# Patient Record
Sex: Male | Born: 2000 | Race: Black or African American | Hispanic: No | Marital: Single | Smoking: Current every day smoker
Health system: Southern US, Community
[De-identification: ages and names within clinical notes are randomized; demographics above are authoritative.]

## PROBLEM LIST (undated history)

## (undated) DIAGNOSIS — K219 Gastro-esophageal reflux disease without esophagitis: Secondary | ICD-10-CM

## (undated) DIAGNOSIS — J309 Allergic rhinitis, unspecified: Secondary | ICD-10-CM

## (undated) DIAGNOSIS — J45909 Unspecified asthma, uncomplicated: Secondary | ICD-10-CM

## (undated) HISTORY — DX: Allergic rhinitis, unspecified: J30.9

## (undated) HISTORY — DX: Gastro-esophageal reflux disease without esophagitis: K21.9

## (undated) HISTORY — DX: Unspecified asthma, uncomplicated: J45.909

## (undated) HISTORY — PX: EYE SURGERY: SHX253

---
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
## 2019-08-17 ENCOUNTER — Telehealth: Payer: Self-pay | Admitting: Allergy & Immunology

## 2019-08-17 MED ORDER — ALBUTEROL SULFATE HFA 108 (90 BASE) MCG/ACT IN AERS: 2.0000 | INHALATION_SPRAY | RESPIRATORY_TRACT | 0 refills | Status: AC | PRN

## 2019-08-17 NOTE — Telephone Encounter (Signed)
Mom called requesting a refill of his albuterol due to a persistent cough. Refill sent in. Mom also requested an office visit for Monday. I placed him into a 10:45 slot with Gareth Morgan.   Salvatore Marvel, MD [facility]

## 2019-08-19 ENCOUNTER — Encounter: Payer: Self-pay | Admitting: Family Medicine

## 2019-08-19 ENCOUNTER — Ambulatory Visit (INDEPENDENT_AMBULATORY_CARE_PROVIDER_SITE_OTHER): Payer: Medicaid Other | Admitting: Family Medicine

## 2019-08-19 ENCOUNTER — Other Ambulatory Visit: Payer: Self-pay

## 2019-08-19 VITALS — BP 118/72 | HR 72 | Temp 98.3°F | Resp 20 | Ht 67.95 in | Wt 126.0 lb

## 2019-08-19 DIAGNOSIS — T7800XD Anaphylactic reaction due to unspecified food, subsequent encounter: Secondary | ICD-10-CM | POA: Diagnosis not present

## 2019-08-19 DIAGNOSIS — J4531 Mild persistent asthma with (acute) exacerbation: Secondary | ICD-10-CM | POA: Diagnosis not present

## 2019-08-19 DIAGNOSIS — T7800XA Anaphylactic reaction due to unspecified food, initial encounter: Secondary | ICD-10-CM | POA: Insufficient documentation

## 2019-08-19 DIAGNOSIS — J301 Allergic rhinitis due to pollen: Secondary | ICD-10-CM | POA: Diagnosis not present

## 2019-08-19 MED ORDER — FLUTICASONE PROPIONATE 50 MCG/ACT NA SUSP: NASAL | 5 refills | Status: AC

## 2019-08-19 MED ORDER — FLOVENT HFA 110 MCG/ACT IN AERO: 2.0000 | INHALATION_SPRAY | Freq: Two times a day (BID) | RESPIRATORY_TRACT | 5 refills | Status: AC

## 2019-08-19 MED ORDER — CETIRIZINE HCL 10 MG PO TABS: ORAL_TABLET | ORAL | 5 refills | Status: AC

## 2019-08-19 MED ORDER — FLOVENT HFA 44 MCG/ACT IN AERO: 2.0000 | INHALATION_SPRAY | Freq: Two times a day (BID) | RESPIRATORY_TRACT | 5 refills | Status: DC

## 2019-08-19 MED ORDER — EPINEPHRINE 0.3 MG/0.3ML IJ SOAJ: INTRAMUSCULAR | 3 refills | Status: AC

## 2019-08-19 MED ORDER — ALBUTEROL SULFATE HFA 108 (90 BASE) MCG/ACT IN AERS: INHALATION_SPRAY | RESPIRATORY_TRACT | 1 refills | Status: AC

## 2019-08-19 MED ORDER — MONTELUKAST SODIUM 10 MG PO TABS: 10.0000 mg | ORAL_TABLET | Freq: Every day | ORAL | 5 refills | Status: AC

## 2019-08-19 NOTE — Progress Notes (Signed)
[address] [place] [id number] Dept: [phone]  FOLLOW UP NOTE  Patient ID: Benjamin Poole, male    DOB: 03/02/2001  Age: 18 y.o. MRN: [id number] Date of Office Visit: 08/19/2019  Assessment  Chief Complaint: Asthma and Allergic Rhinitis   HPI Benjamin Poole is a 18 year old male who presents to the clinic for an evaluation of an asthma flare. He was last seen in this clinic on 11/25/2016 by Dr. Dellis AnesGallagher for evaluation of asthma and allergic rhinitis, food allergy, and acute sinusitis. He reports his asthma has not been well controlled over the last 3 days with shortness of breath, dry cough, and wheeze which is worse at night. He is currently using ProAir about 2 days a week. He is out of montelukast and Qvar at this time and last used these about 1 year ago. Allergic rhinitis is reported as moderately well controlled with runny nose for which he is using cetirizine and Flonase as needed, which is infrequently. He continues to avoid eggs in lesser cooked forma and is eating products with baked egg with no adverse effects. He does not have an epinephrine device at this time. His current medications are listed in the chart.    Drug Allergies:  Allergies  Allergen Reactions  . Eggs Or Egg-Derived Products Hives    Physical Exam: BP 118/72   Pulse 72   Temp 98.3 F (36.8 C) (Oral)   Resp 20   Ht 5' 7.95" (1.726 m)   Wt 126 lb (57.2 kg)   SpO2 99%   BMI 19.18 kg/m    Physical Exam Vitals signs reviewed.  Constitutional:      Appearance: Normal appearance.  HENT:     Head: Normocephalic and atraumatic.     Right Ear: Tympanic membrane normal.     Left Ear: Tympanic membrane normal.     Nose:     Comments: Bilateral nares slightly erythematous with clear nasal drainage noted. pharynx normal. Ears normal. Eyes normal.    Mouth/Throat:     Pharynx: Oropharynx is clear.  Eyes:     Conjunctiva/sclera: Conjunctivae normal.  Neck:     Musculoskeletal: Normal range of  motion and neck supple.  Cardiovascular:     Rate and Rhythm: Normal rate and regular rhythm.     Heart sounds: Normal heart sounds. No murmur.  Pulmonary:     Effort: Pulmonary effort is normal.     Breath sounds: Normal breath sounds.     Comments: Lungs clear to auscultation Musculoskeletal: Normal range of motion.  Skin:    General: Skin is warm and dry.  Neurological:     Mental Status: He is alert and oriented to person, place, and time.  Psychiatric:        Mood and Affect: Mood normal.        Behavior: Behavior normal.        Thought Content: Thought content normal.        Judgment: Judgment normal.     Diagnostics: FVC 4.66, FEV1 3.97. Predicted FVC 4.32, predicted FEV1 3.72. Spirometry indicates normal ventilatory function.  Assessment and Plan: 1. Mild persistent asthma with acute exacerbation   2. Allergic rhinitis due to pollen, unspecified seasonality   3. Anaphylactic shock due to food, subsequent encounter     Meds ordered this encounter  Medications  . EPINEPHrine (EPIPEN 2-PAK) 0.3 mg/0.3 mL IJ SOAJ injection    Sig: Use as directed for severe allergic reactions    Dispense:  4 each    Refill:  3    Put on hold, will call when needed  . cetirizine (ZYRTEC) 10 MG tablet    Sig: One tablet daily as needed for runny nose    Dispense:  30 tablet    Refill:  5  . fluticasone (FLONASE) 50 MCG/ACT nasal spray    Sig: 1-2 sprays in each nostril once a day as needed for stuffy nose    Dispense:  16 g    Refill:  5  . albuterol (PROAIR HFA) 108 (90 Base) MCG/ACT inhaler    Sig: INHALE 2 PUFFS INTO THE LUNGS EVERY 4 (FOUR) HOURS AS NEEDED FOR COUGHING, WHEEZING OR SHORTNESS OF BREATH. Use with spacer    Dispense:  18 g    Refill:  1  . DISCONTD: fluticasone (FLOVENT HFA) 44 MCG/ACT inhaler    Sig: Inhale 2 puffs into the lungs 2 (two) times daily. Use with spacer. Rinse, gargle and spit out after use.    Dispense:  1 Inhaler    Refill:  5  . montelukast  (SINGULAIR) 10 MG tablet    Sig: Take 1 tablet (10 mg total) by mouth at bedtime.    Dispense:  34 tablet    Refill:  5  . fluticasone (FLOVENT HFA) 110 MCG/ACT inhaler    Sig: Inhale 2 puffs into the lungs 2 (two) times daily. Use with spacer. Rinse, gargle and spit out after use    Dispense:  1 Inhaler    Refill:  5    Patient Instructions  Asthma  Begin montelukast 10 mg once a day to prevent cough or wheeze For now and for asthma flares, begin Flovent 110-2 puffs twice a day with a spacer for 2 weeks or until cough and wheeze free Continue ProAir 2 puffs every 4 hours as needed for cough or wheeze  Allergic rhinitis Continue cetrizine 10 mg once a day as needed for a runny nose Begin Flonase 1-2 sprays in each nostril once a day as needed for a stuffy nose Consider nasal saline rinses as needed for nasal symptoms  Food allergy Continue to avoid egg. Continue to eat products with cooked egg as you are tolerating these with no symptoms (some examples are cake, pasta, and breads). In case of an allergic reaction, give Benadryl 50 mg every 4 hours, and if life-threatening symptoms occur, inject with EpiPen 0.3 mg.  Call the clinic if this treatment plan is not working well for you  Follow up in 2 months or sooner if needed  Thank you for the opportunity to care for this patient.  Please do not hesitate to contact me with questions.  Gareth Morgan, FNP [facility]  I have provided oversight concerning Gareth Morgan' evaluation and treatment of this patient's health issues addressed during today's encounter. I agree with the assessment and therapeutic plan as outlined in the note.   Return in about 2 months (around 10/19/2019), or if symptoms worsen or fail to improve.    Thank you for the opportunity to care for this patient.  Please do not hesitate to contact me with questions.  Penne Lash, M.D.  [facility] [address] [place], [place] [address] [phone]

## 2019-08-19 NOTE — Patient Instructions (Addendum)
Asthma  Begin montelukast 10 mg once a day to prevent cough or wheeze For now and for asthma flares, begin Flovent 110-2 puffs twice a day with a spacer for 2 weeks or until cough and wheeze free Continue ProAir 2 puffs every 4 hours as needed for cough or wheeze  Allergic rhinitis Continue cetrizine 10 mg once a day as needed for a runny nose Begin Flonase 1-2 sprays in each nostril once a day as needed for a stuffy nose Consider nasal saline rinses as needed for nasal symptoms  Food allergy Continue to avoid egg. Continue to eat products with cooked egg as you are tolerating these with no symptoms (some examples are cake, pasta, and breads). In case of an allergic reaction, give Benadryl 50 mg every 4 hours, and if life-threatening symptoms occur, inject with EpiPen 0.3 mg.  Call the clinic if this treatment plan is not working well for you  Follow up in 2 months or sooner if needed

## 2019-09-18 ENCOUNTER — Telehealth: Payer: Self-pay | Admitting: Allergy & Immunology

## 2019-09-18 NOTE — Telephone Encounter (Signed)
Pt last seen 9/14. Please advise if we can schedule pt to come get flu vaccine with [place].

## 2019-09-18 NOTE — Telephone Encounter (Signed)
Can you please have him make an appointment for skin testing to egg. His egg and egg components were negative. We have never skin tested him. Thank you.

## 2019-09-18 NOTE — Telephone Encounter (Signed)
Pt mom called to see if we had the flu shot without the egg. [phone].

## 2019-09-19 NOTE — Telephone Encounter (Signed)
Tried calling [phone] and line says unavailable.

## 2019-09-20 NOTE — Telephone Encounter (Signed)
Tried calling x 2- # unavailable.

## 2022-01-13 ENCOUNTER — Emergency Department (HOSPITAL_BASED_OUTPATIENT_CLINIC_OR_DEPARTMENT_OTHER)
Admission: EM | Admit: 2022-01-13 | Discharge: 2022-01-13 | Disposition: A | Discharge status: Home / Self Care | Payer: Worker's Compensation | Attending: Emergency Medicine | Admitting: Emergency Medicine

## 2022-01-13 ENCOUNTER — Emergency Department (HOSPITAL_BASED_OUTPATIENT_CLINIC_OR_DEPARTMENT_OTHER): Payer: Worker's Compensation

## 2022-01-13 ENCOUNTER — Other Ambulatory Visit: Payer: Self-pay

## 2022-01-13 ENCOUNTER — Other Ambulatory Visit (HOSPITAL_BASED_OUTPATIENT_CLINIC_OR_DEPARTMENT_OTHER): Payer: Self-pay

## 2022-01-13 ENCOUNTER — Encounter (HOSPITAL_BASED_OUTPATIENT_CLINIC_OR_DEPARTMENT_OTHER): Payer: Self-pay

## 2022-01-13 DIAGNOSIS — Y990 Civilian activity done for income or pay: Secondary | ICD-10-CM | POA: Diagnosis not present

## 2022-01-13 DIAGNOSIS — W208XXA Other cause of strike by thrown, projected or falling object, initial encounter: Secondary | ICD-10-CM | POA: Insufficient documentation

## 2022-01-13 DIAGNOSIS — S99921A Unspecified injury of right foot, initial encounter: Secondary | ICD-10-CM | POA: Diagnosis present

## 2022-01-13 DIAGNOSIS — S92534A Nondisplaced fracture of distal phalanx of right lesser toe(s), initial encounter for closed fracture: Secondary | ICD-10-CM | POA: Insufficient documentation

## 2022-01-13 MED ORDER — IBUPROFEN 600 MG PO TABS
600.0000 mg | ORAL_TABLET | Freq: Four times a day (QID) | ORAL | 0 refills | Status: AC | PRN
  Filled 2022-01-13: qty 30, 8d supply, fill #0

## 2022-01-13 NOTE — Discharge Instructions (Addendum)
You were seen here for evaluation of your foot pain.  Your x-ray showed a slight nondisplaced fracture of your right fifth toe.  We have buddy taped this to provide support in addition to the post-op shoe. Please leave the shoe on unless sleeping or showering.  You can take ibuprofen 600 mg as needed for pain.  I have sent this prescription into the med center outpatient pharmacy.  Additionally, I have given you a referral to Dr. Clare Gandy but she will need to follow-up with.  Please call his office on discharge to schedule your appointment with him soon.  If you have any worsening pain, numbness, tingling, or weakness of the foot, please return to the ER for re-evaluation.

## 2022-01-13 NOTE — ED Provider Notes (Signed)
[facility] EMERGENCY DEPARTMENT Provider Note   CSN: [id number] Arrival date & time: 01/13/22  1000     History  Chief Complaint  Patient presents with   Foot Injury    Benjamin Poole is a 21 y.o. male otherwise healthy presents the emergency department for evaluation of right foot pain after dropping a pallet on it yesterday at work.  He denies any other injury.  He reports some bruising and swelling to the lateral aspect of his right foot.  He denies any numbness, tingling, or weakness.  He is still been ambulatory on the foot.  Denies any allergies to any medications.   Foot Injury     Home Medications Prior to Admission medications   Medication Sig Start Date End Date Taking? Authorizing Provider  ibuprofen (ADVIL) 600 MG tablet Take 1 tablet (600 mg total) by mouth every 6 (six) hours as needed. 01/13/22  Yes Achille Rich, PA-C  albuterol (PROAIR HFA) 108 (90 Base) MCG/ACT inhaler INHALE 2 PUFFS INTO THE LUNGS EVERY 4 (FOUR) HOURS AS NEEDED FOR COUGHING, WHEEZING OR SHORTNESS OF BREATH. Use with spacer 08/19/19   Ambs, Norvel Richards, FNP  albuterol (VENTOLIN HFA) 108 (90 Base) MCG/ACT inhaler Inhale 2 puffs into the lungs every 4 (four) hours as needed for wheezing or shortness of breath. Patient not taking: Reported on 08/19/2019 08/17/19   Alfonse Spruce, MD  amoxicillin-clavulanate (AUGMENTIN) [phone] MG tablet One tablet by mouth twice daily for 14 days Patient not taking: Reported on 08/19/2019 11/25/16   Alfonse Spruce, MD  beclomethasone (QVAR) 40 MCG/ACT inhaler Inhale 2 puffs into the lungs 2 (two) times daily as needed. Patient not taking: Reported on 08/19/2019 11/25/16   Alfonse Spruce, MD  cetirizine (ZYRTEC) 10 MG tablet One tablet daily as needed for runny nose 08/19/19   Hetty Blend, FNP  EPINEPHrine (EPIPEN 2-PAK) 0.3 mg/0.3 mL IJ SOAJ injection Use as directed for severe allergic reactions 08/19/19   Ambs, Norvel Richards, FNP  fluticasone [facility]) 50  MCG/ACT nasal spray 1-2 sprays in each nostril once a day as needed for stuffy nose 08/19/19   Ambs, Norvel Richards, FNP  fluticasone (FLOVENT HFA) 110 MCG/ACT inhaler Inhale 2 puffs into the lungs 2 (two) times daily. Use with spacer. Rinse, gargle and spit out after use 08/19/19   Ambs, Norvel Richards, FNP  montelukast (SINGULAIR) 10 MG tablet Take 1 tablet (10 mg total) by mouth at bedtime. 08/19/19   Ambs, Norvel Richards, FNP  olopatadine (PATANOL) 0.1 % ophthalmic solution Place 1 drop into both eyes daily. Patient not taking: Reported on 08/19/2019 12/19/16   Fletcher Anon, MD  Olopatadine HCl (PATADAY) 0.2 % SOLN One drop both eyes daily as needed for itching. Patient not taking: Reported on 08/19/2019 01/05/16   Fletcher Anon, MD  omeprazole (PRILOSEC) 20 MG capsule Take 1 capsule (20 mg total) by mouth daily. Patient not taking: Reported on 08/19/2019 11/25/16   Alfonse Spruce, MD      Allergies    Eggs or egg-derived products    Review of Systems   Review of Systems  Musculoskeletal:  Positive for myalgias. Negative for joint swelling.  Skin:  Positive for color change.  Neurological:  Negative for weakness and numbness.   Physical Exam Updated Vital Signs BP 128/68 (BP Location: Left Arm)    Pulse 72    Temp 98.2 F (36.8 C) (Oral)    Resp 18    Ht 5\' 10"  (1.778  m)    Wt 65.8 kg    SpO2 100%    BMI 20.81 kg/m  Physical Exam Vitals and nursing note reviewed.  Constitutional:      Appearance: Normal appearance.  Eyes:     General: No scleral icterus. Cardiovascular:     Rate and Rhythm: Normal rate.  Pulmonary:     Effort: Pulmonary effort is normal. No respiratory distress.  Musculoskeletal:        General: Tenderness and signs of injury present.     Comments: Patient ambulatory in the emergency department.  He has some ecchymosis and swelling noted to the fifth toe on the right foot.  No overlying abrasion or laceration noted.  Tenderness to all aspects of the fifth toe.  He has some  mild tenderness and swelling going into the area inferior of the toe as well.  Cap refill still intact.  Patient is still able to move foot and toes with pain.  No tenderness to the heel midfoot.  Dorsi and plantarflexion intact.  DP and PT pulses intact bilaterally.  Compartments are soft.  Sensation intact throughout.  Skin:    General: Skin is dry.     Findings: No rash.  Neurological:     General: No focal deficit present.     Mental Status: He is alert. Mental status is at baseline.  Psychiatric:        Mood and Affect: Mood normal.       ED Results / Procedures / Treatments   Labs (all labs ordered are listed, but only abnormal results are displayed) Labs Reviewed - No data to display  EKG None  Radiology DG Foot Complete Right  Addendum Date: 01/13/2022   ADDENDUM REPORT: 01/13/2022 11:51 ADDENDUM: After additional review, there is a subtle fracture line of the distal phalanx of the small toe which likely represents a nondisplaced fracture. Electronically Signed   By: Allegra Lai M.D.   On: 01/13/2022 11:51   Result Date: 01/13/2022 CLINICAL DATA:  Pain EXAM: RIGHT FOOT COMPLETE - 3 VIEW COMPARISON:  None. FINDINGS: There is no evidence of fracture or dislocation. There is no evidence of arthropathy or other focal bone abnormality. Soft tissues are unremarkable. IMPRESSION: Negative. Electronically Signed: By: Allegra Lai M.D. On: 01/13/2022 11:21    Procedures Procedures   Medications Ordered in ED Medications - No data to display  ED Course/ Medical Decision Making/ A&P                           Medical Decision Making Amount and/or Complexity of Data Reviewed Radiology: ordered.  Risk Prescription drug management.   21 year old male presents emerged department after dropping a pallet on his foot.  Differential diagnosis includes but is not limited to sprain, strain, dislocation, fracture, open fracture, contusion, Jones fracture, Lisfranc fracture.   Vital signs are stable.  Patient normotensive, normal pulse rate, afebrile, satting well room air.  Physical exam is pertinent for a swollen and bruised fifth toe on the right foot with tenderness to palpation.  No midfoot tenderness or heel tenderness.  Patient is still able to bear weight just with pain.  Will order x-ray to rule out any fracture.  I independently reviewed and interpreted the patient's imaging.  Radiologist interpreted the x-ray as normal.  After discussion with Dr. Dorothey Baseman to review the fifth digit for a possible fracture after my review of the images, she agrees that this is possibly a  nondisplaced fracture.  Given the nondisplaced fracture there is also no overlying abrasion or laceration to the area, no concern for an open fracture.  X-ray ruled out any Jones or Lisfranc fracture.  No dislocation.  This is likely due to the nondisplaced fracture of the fifth toe.  Given the x-ray findings, will place patient with buddy tape and a shoe for comfort.  Advised him to weight-bear as tolerated and to follow-up with Dr. Clare Gandy with outpatient Ortho.  Prescribed him 600 mg of ibuprofen to take as needed.  Advised him of the RICE method.  Return precautions discussed.  Patient agrees to plan.  Patient is stable being discharged home in good condition.  Final Clinical Impression(s) / ED Diagnoses Final diagnoses:  Closed nondisplaced fracture of distal phalanx of lesser toe of right foot, initial encounter    Rx / DC Orders ED Discharge Orders          Ordered    ibuprofen (ADVIL) 600 MG tablet  Every 6 hours PRN        01/13/22 1238              Achille Rich, PA-C 01/14/22 1115    Vanetta Mulders, MD 01/16/22 [phone]

## 2022-01-13 NOTE — ED Triage Notes (Signed)
Pt states that he was at work at [employer] yesterday and "smashed" his foot with a wooden pallet, pt states that he has some swelling and pain to his R foot.

## 2022-01-13 NOTE — ED Notes (Signed)
Pt in bed, tech at bedside buddy taping toe and placing post op shoe, pt states that he is ready to go home, pt verbalized understanding d/c instructions and follow up.  Work note given, pt from dpt

## 2022-02-22 ENCOUNTER — Other Ambulatory Visit: Payer: Self-pay

## 2022-02-22 ENCOUNTER — Encounter (HOSPITAL_BASED_OUTPATIENT_CLINIC_OR_DEPARTMENT_OTHER): Payer: Self-pay

## 2022-02-22 ENCOUNTER — Emergency Department (HOSPITAL_BASED_OUTPATIENT_CLINIC_OR_DEPARTMENT_OTHER)
Admission: EM | Admit: 2022-02-22 | Discharge: 2022-02-23 | Disposition: A | Discharge status: Home / Self Care | Payer: Managed Care, Other (non HMO) | Attending: Emergency Medicine | Admitting: Emergency Medicine

## 2022-02-22 DIAGNOSIS — R5383 Other fatigue: Secondary | ICD-10-CM | POA: Insufficient documentation

## 2022-02-22 DIAGNOSIS — J45909 Unspecified asthma, uncomplicated: Secondary | ICD-10-CM | POA: Diagnosis not present

## 2022-02-22 DIAGNOSIS — R519 Headache, unspecified: Secondary | ICD-10-CM | POA: Diagnosis present

## 2022-02-22 DIAGNOSIS — Z7951 Long term (current) use of inhaled steroids: Secondary | ICD-10-CM | POA: Diagnosis not present

## 2022-02-22 DIAGNOSIS — H538 Other visual disturbances: Secondary | ICD-10-CM | POA: Diagnosis not present

## 2022-02-22 NOTE — ED Triage Notes (Signed)
Patient also complains of runny nose and states he feels dehydrated.  ?

## 2022-02-22 NOTE — ED Triage Notes (Signed)
Patient complains of migraine headache which began around 2130.  He states he has been having headaches consistently every day for about a month.  Took Excedrin around 2200 tonight.  ?

## 2022-02-23 ENCOUNTER — Emergency Department (HOSPITAL_BASED_OUTPATIENT_CLINIC_OR_DEPARTMENT_OTHER): Payer: Managed Care, Other (non HMO)

## 2022-02-23 DIAGNOSIS — R519 Headache, unspecified: Secondary | ICD-10-CM | POA: Diagnosis not present

## 2022-02-23 LAB — CBC WITH DIFFERENTIAL/PLATELET
Abs Immature Granulocytes: 0.01 10*3/uL (ref 0.00–0.07)
Basophils Absolute: 0 10*3/uL (ref 0.0–0.1)
Basophils Relative: 0 %
Eosinophils Absolute: 0.1 10*3/uL (ref 0.0–0.5)
Eosinophils Relative: 1 %
HCT: 39.3 % (ref 39.0–52.0)
Hemoglobin: 13.4 g/dL (ref 13.0–17.0)
Immature Granulocytes: 0 %
Lymphocytes Relative: 29 %
Lymphs Abs: 1.5 10*3/uL (ref 0.7–4.0)
MCH: 31.8 pg (ref 26.0–34.0)
MCHC: 34.1 g/dL (ref 30.0–36.0)
MCV: 93.1 fL (ref 80.0–100.0)
Monocytes Absolute: 0.5 10*3/uL (ref 0.1–1.0)
Monocytes Relative: 9 %
Neutro Abs: 3.1 10*3/uL (ref 1.7–7.7)
Neutrophils Relative %: 61 %
Platelets: 253 10*3/uL (ref 150–400)
RBC: 4.22 MIL/uL (ref 4.22–5.81)
RDW: 11.6 % (ref 11.5–15.5)
WBC: 5.2 10*3/uL (ref 4.0–10.5)
nRBC: 0 % (ref 0.0–0.2)

## 2022-02-23 LAB — BASIC METABOLIC PANEL
Anion gap: 7 (ref 5–15)
BUN: 13 mg/dL (ref 6–20)
CO2: 29 mmol/L (ref 22–32)
Calcium: 9.3 mg/dL (ref 8.9–10.3)
Chloride: 102 mmol/L (ref 98–111)
Creatinine, Ser: 1.21 mg/dL (ref 0.61–1.24)
GFR, Estimated: >60 mL/min (ref >60)
Glucose, Bld: 102 mg/dL — ABNORMAL HIGH (ref 70–99)
Potassium: 3.6 mmol/L (ref 3.5–5.1)
Sodium: 138 mmol/L (ref 135–145)

## 2022-02-23 MED ORDER — KETOROLAC TROMETHAMINE 30 MG/ML IJ SOLN
30.0000 mg | Freq: Once | INTRAMUSCULAR | Status: AC
  Administered 2022-02-23: 30 mg via INTRAVENOUS
  Filled 2022-02-23: qty 1

## 2022-02-23 MED ORDER — METOCLOPRAMIDE HCL 5 MG/ML IJ SOLN
10.0000 mg | Freq: Once | INTRAMUSCULAR | Status: AC
  Administered 2022-02-23: 10 mg via INTRAVENOUS
  Filled 2022-02-23: qty 2

## 2022-02-23 MED ORDER — SODIUM CHLORIDE 0.9 % IV BOLUS
1000.0000 mL | Freq: Once | INTRAVENOUS | Status: AC
  Administered 2022-02-23: 1000 mL via INTRAVENOUS

## 2022-02-23 NOTE — ED Notes (Signed)
Back from CT

## 2022-02-23 NOTE — ED Provider Notes (Signed)
?[facility] EMERGENCY DEPARTMENT ?Provider Note ? ? ?CSN: [id number] ?Arrival date & time: 02/22/22  2302 ? ?  ? ?History ? ?Chief Complaint  ?Patient presents with  ? Headache  ? ? ?Benjamin Poole is a 21 y.o. male. ? ?Patient is a 21 year old male with history of asthma.  Patient presenting today with complaints of headache.  He reports nightly headaches for the past month.  When he attempts to lie down to sleep, he develops a pressure in his head.  This is mainly behind his right eye and associated with some blurry vision.  He denies any weakness, numbness, nausea, or vomiting.  He denies any recent head injury or trauma.  He denies any fevers, chills, or stiff neck.  He denies history of migraines or previous visits to a physician for headaches. ? ?The history is provided by the patient.  ? ?  ? ?Home Medications ?Prior to Admission medications   ?Medication Sig Start Date End Date Taking? Authorizing Provider  ?albuterol (PROAIR HFA) 108 (90 Base) MCG/ACT inhaler INHALE 2 PUFFS INTO THE LUNGS EVERY 4 (FOUR) HOURS AS NEEDED FOR COUGHING, WHEEZING OR SHORTNESS OF BREATH. Use with spacer 08/19/19   Ambs, Norvel Richards, FNP  ?albuterol (VENTOLIN HFA) 108 (90 Base) MCG/ACT inhaler Inhale 2 puffs into the lungs every 4 (four) hours as needed for wheezing or shortness of breath. ?Patient not taking: Reported on 08/19/2019 08/17/19   Alfonse Spruce, MD  ?amoxicillin-clavulanate (AUGMENTIN) 875-125 MG tablet One tablet by mouth twice daily for 14 days ?Patient not taking: Reported on 08/19/2019 11/25/16   Alfonse Spruce, MD  ?beclomethasone (QVAR) 40 MCG/ACT inhaler Inhale 2 puffs into the lungs 2 (two) times daily as needed. ?Patient not taking: Reported on 08/19/2019 11/25/16   Alfonse Spruce, MD  ?cetirizine (ZYRTEC) 10 MG tablet One tablet daily as needed for runny nose 08/19/19   Ambs, Norvel Richards, FNP  ?EPINEPHrine (EPIPEN 2-PAK) 0.3 mg/0.3 mL IJ SOAJ injection Use as directed for severe allergic  reactions 08/19/19   Ambs, Norvel Richards, FNP  ?fluticasone (FLONASE) 50 MCG/ACT nasal spray 1-2 sprays in each nostril once a day as needed for stuffy nose 08/19/19   Ambs, Norvel Richards, FNP  ?fluticasone (FLOVENT HFA) 110 MCG/ACT inhaler Inhale 2 puffs into the lungs 2 (two) times daily. Use with spacer. Rinse, gargle and spit out after use 08/19/19   Ambs, Norvel Richards, FNP  ?ibuprofen (ADVIL) 600 MG tablet Take 1 tablet (600 mg total) by mouth every 6 (six) hours as needed. 01/13/22   Achille Rich, PA-C  ?montelukast (SINGULAIR) 10 MG tablet Take 1 tablet (10 mg total) by mouth at bedtime. 08/19/19   Hetty Blend, FNP  ?olopatadine (PATANOL) 0.1 % ophthalmic solution Place 1 drop into both eyes daily. ?Patient not taking: Reported on 08/19/2019 12/19/16   Fletcher Anon, MD  ?Olopatadine HCl (PATADAY) 0.2 % SOLN One drop both eyes daily as needed for itching. ?Patient not taking: Reported on 08/19/2019 01/05/16   Fletcher Anon, MD  ?omeprazole (PRILOSEC) 20 MG capsule Take 1 capsule (20 mg total) by mouth daily. ?Patient not taking: Reported on 08/19/2019 11/25/16   Alfonse Spruce, MD  ?   ? ?Allergies    ?Eggs or egg-derived products   ? ?Review of Systems   ?Review of Systems  ?All other systems reviewed and are negative. ? ?Physical Exam ?Updated Vital Signs ?BP 129/83 (BP Location: Left Arm)   Pulse 76   Temp 98.4 ?  F (36.9 ?C) (Oral)   Resp 16   Ht 5\' 9"  (1.753 m)   Wt 63.5 kg   SpO2 100%   BMI 20.67 kg/m?  ?Physical Exam ?Vitals and nursing note reviewed.  ?Constitutional:   ?   General: He is not in acute distress. ?   Appearance: He is well-developed. He is not diaphoretic.  ?HENT:  ?   Head: Normocephalic and atraumatic.  ?Eyes:  ?   Extraocular Movements: Extraocular movements intact.  ?   Pupils: Pupils are equal, round, and reactive to light.  ?Cardiovascular:  ?   Rate and Rhythm: Normal rate and regular rhythm.  ?   Heart sounds: No murmur heard. ?  No friction rub.  ?Pulmonary:  ?   Effort: Pulmonary effort  is normal. No respiratory distress.  ?   Breath sounds: Normal breath sounds. No wheezing or rales.  ?Abdominal:  ?   General: Bowel sounds are normal. There is no distension.  ?   Palpations: Abdomen is soft.  ?   Tenderness: There is no abdominal tenderness.  ?Musculoskeletal:     ?   General: Normal range of motion.  ?   Cervical back: Normal range of motion and neck supple.  ?Skin: ?   General: Skin is warm and dry.  ?Neurological:  ?   Mental Status: He is alert and oriented to person, place, and time. Mental status is at baseline.  ?   Cranial Nerves: No cranial nerve deficit.  ?   Motor: No weakness.  ?   Coordination: Coordination normal.  ? ? ?ED Results / Procedures / Treatments   ?Labs ?(all labs ordered are listed, but only abnormal results are displayed) ?Labs Reviewed - No data to display ? ?EKG ?None ? ?Radiology ?No results found. ? ?Procedures ?Procedures  ? ? ?Medications Ordered in ED ?Medications  ?sodium chloride 0.9 % bolus 1,000 mL (has no administration in time range)  ?ketorolac (TORADOL) 30 MG/ML injection 30 mg (has no administration in time range)  ?metoCLOPramide (REGLAN) injection 10 mg (has no administration in time range)  ? ? ?ED Course/ Medical Decision Making/ A&P ? ?Patient presenting here with complaints of headache and fatigue as described in the HPI.  He describes daily headaches for the past month that are worse when he lies down at night. ? ?Patient arrives here with stable vital signs and is neurologically intact.  His head CT is negative and he appears to be feeling better after receiving IV fluids and Toradol/Reglan. ? ?I doubt an emergent cause and do not feel as though further work-up is indicated at this time.  Patient will be treated with continued over-the-counter medications, rest, and follow-up as needed. ? ?Final Clinical Impression(s) / ED Diagnoses ?Final diagnoses:  ?None  ? ? ?Rx / DC Orders ?ED Discharge Orders   ? ? None  ? ?  ? ? ?  ? ,  MD ?02/23/22 [phone] ? ?

## 2022-02-23 NOTE — Discharge Instructions (Signed)
Take Tylenol 1000 mg rotated with ibuprofen 600 mg every 4 hours as needed for pain. ? ?Rest. ? ?Follow-up with primary doctor if symptoms persist, and return to the ER if symptoms significantly worsen or change. ?

## 2022-02-23 NOTE — ED Notes (Signed)
Patient ambulated to the bathroom. Urine sample provided if needed. Patient is now with CT ?

## 2022-03-02 ENCOUNTER — Other Ambulatory Visit (HOSPITAL_BASED_OUTPATIENT_CLINIC_OR_DEPARTMENT_OTHER): Payer: Self-pay

## 2023-05-15 IMAGING — CT CT HEAD W/O CM
3 series · 15 of 47 positions shown, 18 images · non-contrast
Comparison: None.

CLINICAL DATA: Headache.



[Series 2: head wo · axial · 0.45mm/px · z∈[+164,+310]mm · 9 of 35 slices shown, 12 images]
[im 3/35  brain]
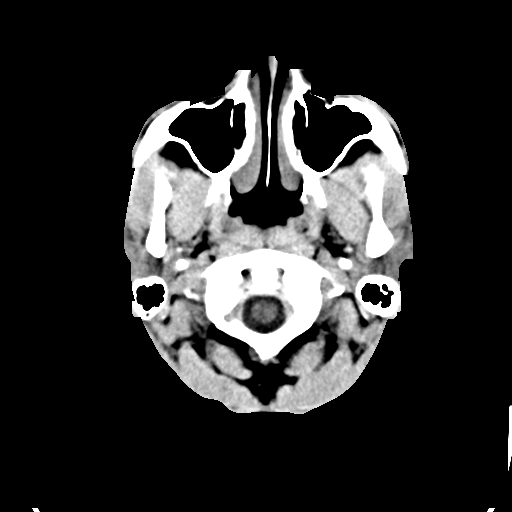
[im 3/35  bone]
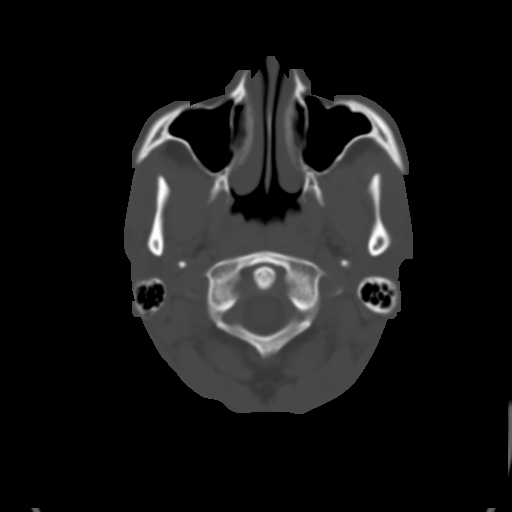
[im 6/35  brain]
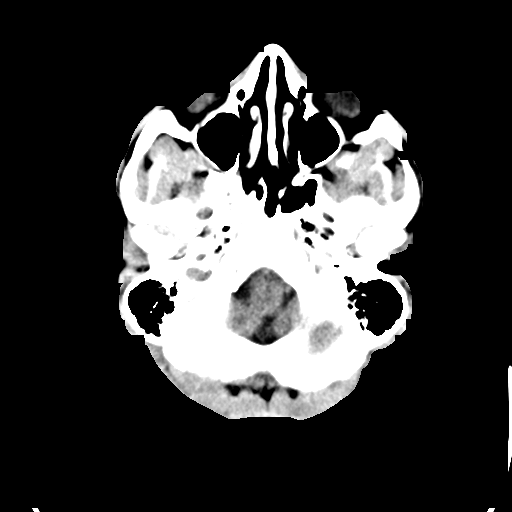
[im 10/35  brain]
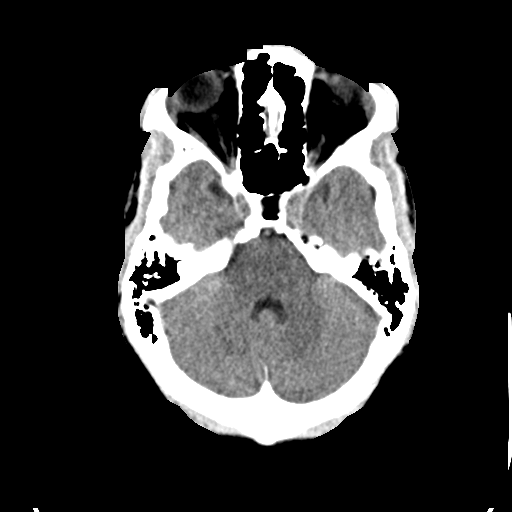
[im 13/35  brain]
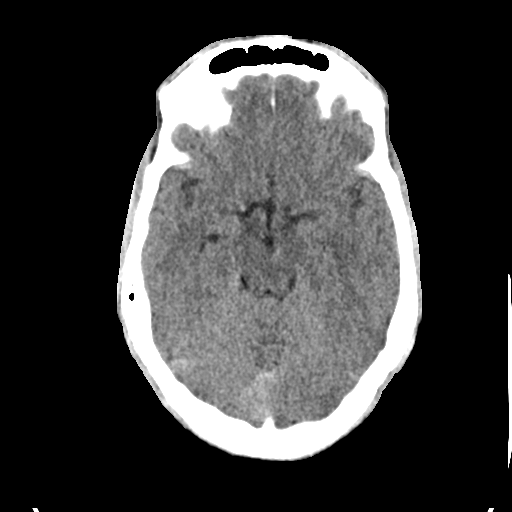
[im 18/35  brain]
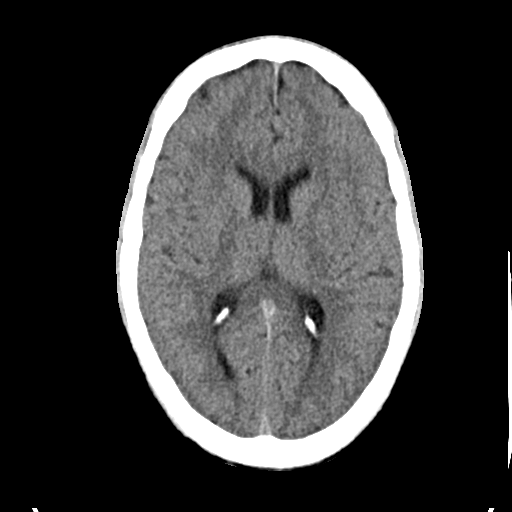
[im 18/35  bone]
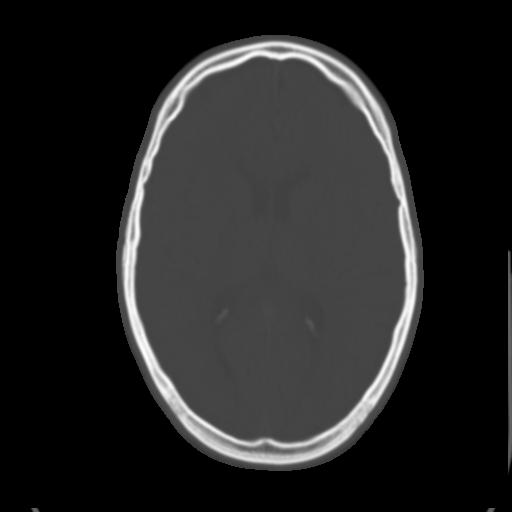
[im 22/35  brain]
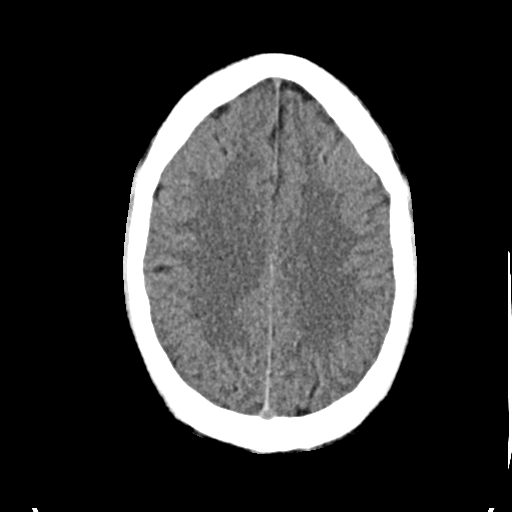
[im 25/35  brain]
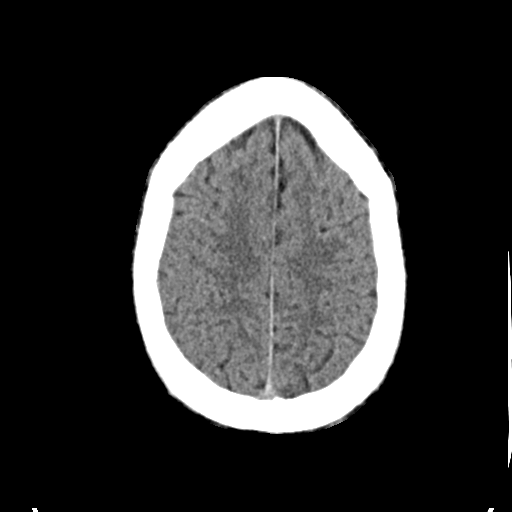
[im 29/35  brain]
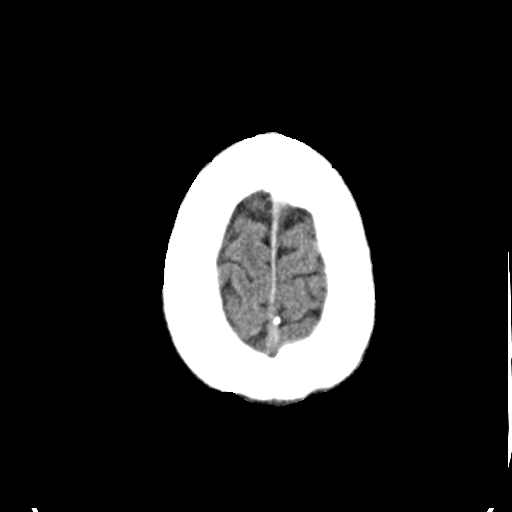
[im 32/35  brain]
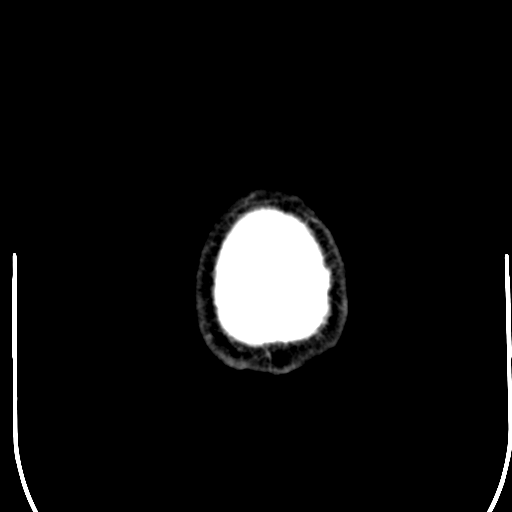
[im 32/35  bone]
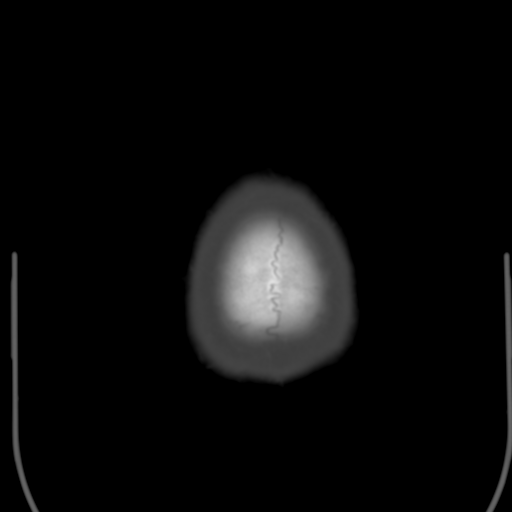

[Series 4: coronal soft · coronal · 0.35mm/px · 3 of 74 slices shown]
[im 25/74  brain]
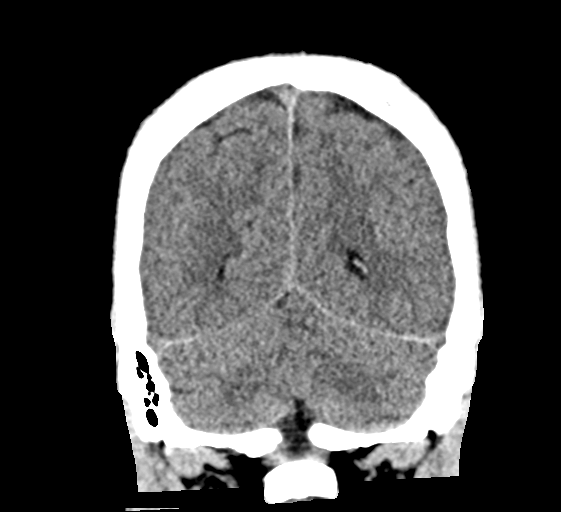
[im 33/74  brain]
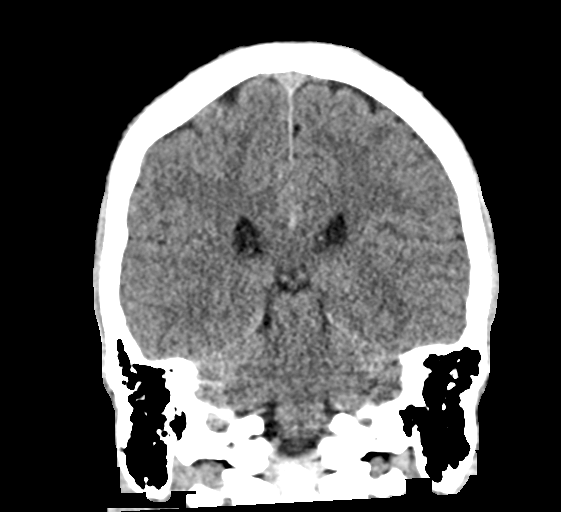
[im 41/74  brain]
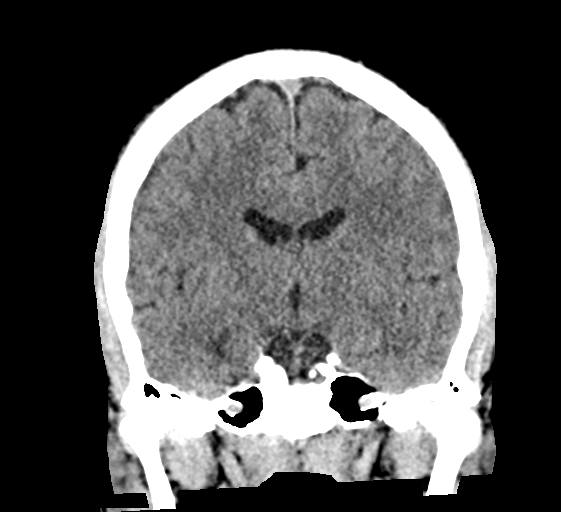

[Series 5: sag soft · sagittal · 0.34mm/px · 3 of 59 slices shown]
[im 20/59  brain]
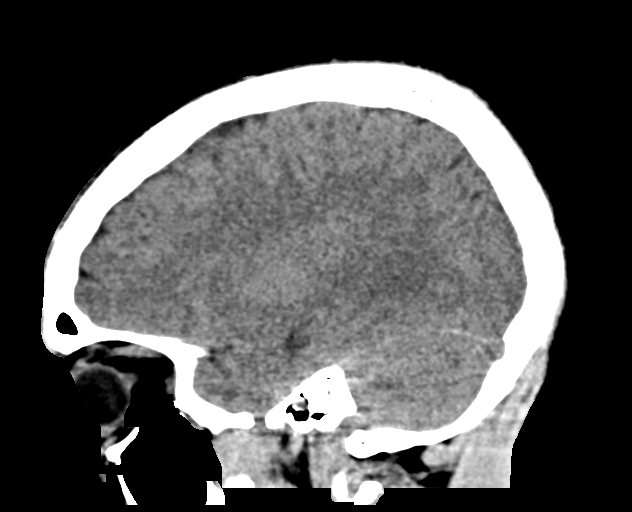
[im 30/59  brain]
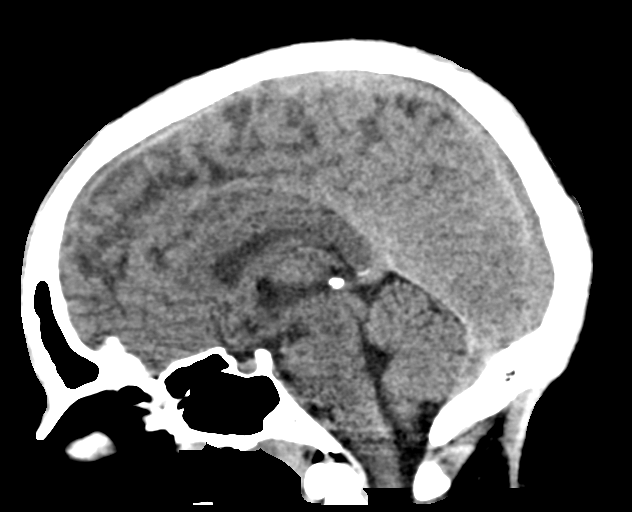
[im 39/59  brain]
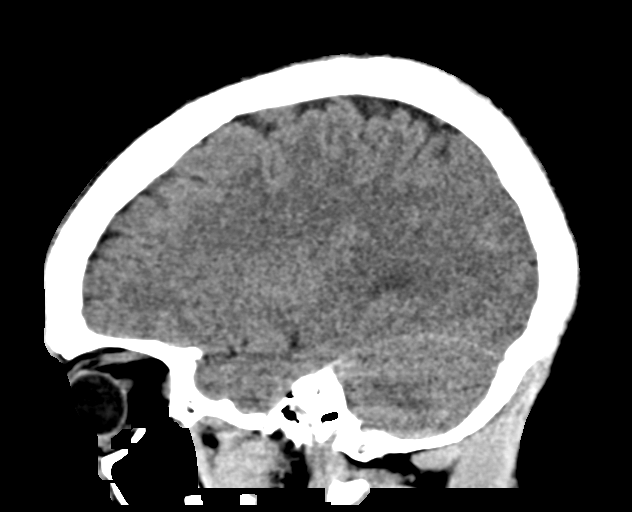

[15 of 47 positions shown; findings below may reference images not displayed]

FINDINGS: Brain: The ventricles and sulci are appropriate size for the
patient's age. The gray-white matter discrimination is preserved.
There is no acute intracranial hemorrhage. No mass effect or midline
shift. No extra-axial fluid collection.

Vascular: No hyperdense vessel or unexpected calcification.

Skull: Normal. Negative for fracture or focal lesion.

Sinuses/Orbits: No acute finding.

Other: None
IMPRESSION: Unremarkable noncontrast CT of the brain.

## 2024-10-25 ENCOUNTER — Encounter (HOSPITAL_BASED_OUTPATIENT_CLINIC_OR_DEPARTMENT_OTHER): Payer: Self-pay | Admitting: Emergency Medicine

## 2024-10-25 ENCOUNTER — Emergency Department (HOSPITAL_BASED_OUTPATIENT_CLINIC_OR_DEPARTMENT_OTHER)
Admission: EM | Admit: 2024-10-25 | Discharge: 2024-10-25 | Disposition: A | Discharge status: Home / Self Care | Payer: Self-pay | Attending: Emergency Medicine | Admitting: Emergency Medicine

## 2024-10-25 ENCOUNTER — Emergency Department (HOSPITAL_BASED_OUTPATIENT_CLINIC_OR_DEPARTMENT_OTHER): Payer: Self-pay

## 2024-10-25 ENCOUNTER — Other Ambulatory Visit: Payer: Self-pay

## 2024-10-25 DIAGNOSIS — S67192A Crushing injury of right middle finger, initial encounter: Secondary | ICD-10-CM | POA: Insufficient documentation

## 2024-10-25 DIAGNOSIS — S6991XA Unspecified injury of right wrist, hand and finger(s), initial encounter: Secondary | ICD-10-CM

## 2024-10-25 DIAGNOSIS — J45909 Unspecified asthma, uncomplicated: Secondary | ICD-10-CM | POA: Insufficient documentation

## 2024-10-25 DIAGNOSIS — Y990 Civilian activity done for income or pay: Secondary | ICD-10-CM | POA: Insufficient documentation

## 2024-10-25 DIAGNOSIS — W208XXA Other cause of strike by thrown, projected or falling object, initial encounter: Secondary | ICD-10-CM | POA: Insufficient documentation

## 2024-10-25 DIAGNOSIS — F17290 Nicotine dependence, other tobacco product, uncomplicated: Secondary | ICD-10-CM | POA: Insufficient documentation

## 2024-10-25 NOTE — ED Provider Notes (Signed)
 [facility] Provider Note  CSN: [id number] Arrival date & time: 10/25/24 9250  Chief Complaint(s) Finger Injury  HPI Tarvaris Puglia is a 23 y.o. male with past medical history as below, significant for asthma, GERD, who presents to the ED with complaint of right hand injury  Patient reports a pile of vinyl flooring fell onto his right hand.  He is RHD.  Difficulty moving his third digit.  No medication prior to arrival.  No other injuries.  No pain to ipsilateral elbow or wrist.  No head injury, no LOC.  Normal state of health prior to the injury.  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 Authorizing Provider  albuterol  (PROAIR  HFA) 108 (90 Base) MCG/ACT inhaler INHALE 2 PUFFS INTO THE LUNGS EVERY 4 (FOUR) HOURS AS NEEDED FOR COUGHING, WHEEZING OR SHORTNESS OF BREATH. Use with spacer 08/19/19   Ambs, Arlean HERO, FNP  albuterol  (VENTOLIN  HFA) 108 (90 Base) MCG/ACT inhaler Inhale 2 puffs into the lungs every 4 (four) hours as needed for wheezing or shortness of breath. Patient not taking: Reported on 08/19/2019 08/17/19   Iva Marty Saltness, MD  amoxicillin -clavulanate (AUGMENTIN ) 875-125 MG tablet One tablet by mouth twice daily for 14 days Patient not taking: Reported on 08/19/2019 11/25/16   Iva Marty Saltness, MD  beclomethasone (QVAR ) 40 MCG/ACT inhaler Inhale 2 puffs into the lungs 2 (two) times daily as needed. Patient not taking: Reported on 08/19/2019 11/25/16   Iva Marty Saltness, MD  cetirizine  (ZYRTEC ) 10 MG tablet One tablet daily as needed for runny nose 08/19/19    Ambs, Arlean HERO, FNP  EPINEPHrine  (EPIPEN  2-PAK) 0.3 mg/0.3 mL IJ SOAJ injection Use as directed for severe allergic reactions 08/19/19   Ambs, Arlean HERO, FNP  fluticasone  (FLONASE ) 50 MCG/ACT nasal spray 1-2 sprays in each nostril once a day as needed for stuffy nose 08/19/19   Ambs, Arlean HERO, FNP  fluticasone  (FLOVENT  HFA) 110 MCG/ACT inhaler Inhale 2 puffs into the lungs 2 (two) times daily. Use with spacer. Rinse, gargle and spit out after use 08/19/19   Ambs, Arlean HERO, FNP  ibuprofen  (ADVIL ) 600 MG tablet Take 1 tablet (600 mg total) by mouth every 6 (six) hours as needed. 01/13/22   Bernis Ernst, PA-C  montelukast  (SINGULAIR ) 10 MG tablet Take 1 tablet (10 mg total) by mouth at bedtime. 08/19/19   Ambs, Arlean HERO, FNP  olopatadine  (PATANOL) 0.1 % ophthalmic solution Place 1 drop into both eyes daily. Patient not taking: Reported on 08/19/2019 12/19/16   Asa Aloysius LABOR, MD  Olopatadine  HCl (PATADAY ) 0.2 % SOLN One drop both eyes daily as needed for itching. Patient not taking: Reported on 08/19/2019 01/05/16   Asa Aloysius LABOR, MD  omeprazole  (PRILOSEC) 20 MG capsule Take 1 capsule (20 mg total) by mouth daily. Patient not taking: Reported on 08/19/2019 11/25/16   Iva Marty Saltness, MD  Past Surgical History Past Surgical History:  Procedure Laterality Date   EYE SURGERY     Family History Family History  Problem Relation Age of Onset   Asthma Sister    Eczema Sister    Allergic rhinitis Neg Hx    Angioedema Neg Hx    Immunodeficiency Neg Hx    Urticaria Neg Hx     Social History Social History   Tobacco Use   Smoking status: Every Day    Types: Cigars   Smokeless tobacco: Never  Vaping Use   Vaping status: Never Used  Substance Use Topics   Alcohol use: Yes   Drug use: No   Allergies Egg protein-containing drug products  Review of Systems A thorough  review of systems was obtained and all systems are negative except as noted in the HPI and PMH.   Physical Exam Vital Signs  I have reviewed the triage vital signs BP 135/73   Pulse 81   Temp 98.7 F (37.1 C)   Resp 16   Wt 68 kg   SpO2 100%   BMI 22.15 kg/m  Physical Exam Vitals and nursing note reviewed.  Constitutional:      General: He is not in acute distress.    Appearance: Normal appearance. He is well-developed. He is not ill-appearing.  HENT:     Head: Normocephalic and atraumatic.     Right Ear: External ear normal.     Left Ear: External ear normal.     Nose: Nose normal.     Mouth/Throat:     Mouth: Mucous membranes are moist.  Eyes:     General: No scleral icterus.       Right eye: No discharge.        Left eye: No discharge.  Cardiovascular:     Rate and Rhythm: Normal rate.  Pulmonary:     Effort: Pulmonary effort is normal. No respiratory distress.     Breath sounds: No stridor.  Abdominal:     General: Abdomen is flat. There is no distension.     Tenderness: There is no guarding.  Musculoskeletal:        General: No deformity.       Hands:     Cervical back: No rigidity.     Comments: There is slight swelling and abrasion at the PIP of his third digit on the dorsum.  He has full passive and active range of motion to all of his digits to his right hand.  Right upper extremity is NVI.  Sensation intact symmetric both extremities.  Radial pulse brisk.  Capillary fill is brisk. No nailbed damage  Skin:    General: Skin is warm and dry.     Coloration: Skin is not cyanotic, jaundiced or pale.  Neurological:     Mental Status: He is alert.  Psychiatric:        Speech: Speech normal.        Behavior: Behavior normal. Behavior is cooperative.     ED Results and Treatments Labs (all labs ordered are listed, but only abnormal results are displayed) Labs Reviewed - No data to display  Radiology DG Hand Complete Right Result Date: 10/25/2024 EXAM: 3 or more view(s) Xray of the right hand 10/25/2024 08:20:00 AM COMPARISON: None available. CLINICAL HISTORY: Injury FINDINGS: BONES AND JOINTS: No acute fracture. No focal osseous lesion. No joint dislocation. SOFT TISSUES: The soft tissues are unremarkable. IMPRESSION: No acute osseous abnormality. Electronically signed by: Lynwood Seip MD 10/25/2024 08:27 AM EST RP Workstation: [id number]    Pertinent labs & imaging results that were available during my care of the patient were reviewed by me and considered in my medical decision making (see MDM for details).  Medications Ordered in ED Medications - No data to display                                                                                                                                   Procedures Procedures  (including critical care time)  Medical Decision Making / ED Course    Medical Decision Making:    Selso Mannor is a 23 y.o. male with past medical history as below, significant for asthma, GERD, who presents to the ED with complaint of right hand injury. The complaint involves an extensive differential diagnosis and also carries with it a high risk of complications and morbidity.  Serious etiology was considered. Ddx includes but is not limited to: Ligamentous injury, contusion, crush injury, fracture, dislocation, etc.  Complete initial physical exam performed, notably the patient was in no acute distress, resting comfortably.    Reviewed and confirmed nursing documentation for past medical history, family history, social history.  Vital signs reviewed.    Finger injury> - Patient reports a pile of laminate flooring fell onto his right hand, injured his right third digit. - Exam is reassuring, NVI, no obvious ligamentous injury.  He has abrasion to his PIP on the right third digit.  He is RHD. -  Encouraged supportive care at home, ice therapy, strict return precautions, work note  8:43 AM:  I have discussed the diagnosis/risks/treatment options with the patient.  Evaluation and diagnostic testing in the emergency department does not suggest an emergent condition requiring admission or immediate intervention beyond what has been performed at this time.  They will follow up with pcp. We also discussed returning to the ED immediately if new or worsening sx occur. We discussed the sx which are most concerning (e.g., sudden worsening pain, fever, inability to tolerate by mouth, sensation change to finger) that necessitate immediate return.    The patient appears reasonably screened and/or stabilized for discharge and I doubt any other medical condition or other [facility] requiring further screening, evaluation, or treatment in the ED at this time prior to discharge.                       Additional history obtained: -Additional history obtained from na -External records from outside source obtained and reviewed including: Chart review including previous notes, labs, imaging, consultation notes  including  allergies   Lab Tests: na  EKG   EKG Interpretation Date/Time:    Ventricular Rate:    PR Interval:    QRS Duration:    QT Interval:    QTC Calculation:   R Axis:      Text Interpretation:           Imaging Studies ordered: I ordered imaging studies including right hand xr I independently visualized the following imaging with scope of interpretation limited to determining acute life threatening conditions related to emergency care; findings noted above I agree with the radiologist interpretation If any imaging was obtained with contrast I closely monitored patient for any possible adverse reaction a/w contrast administration in the emergency department   Medicines ordered and prescription drug management: No orders of the defined types were placed in this  encounter.   -I have reviewed the patients home medicines and have made adjustments as needed   Consultations Obtained: na   Cardiac Monitoring: Continuous pulse oximetry interpreted by myself, 100% on RA.    Social Determinants of Health:  Diagnosis or treatment significantly limited by social determinants of health: current smoker   Reevaluation: After the interventions noted above, I reevaluated the patient and found that they have stayed the same  Co morbidities that complicate the patient evaluation  Past Medical History:  Diagnosis Date   Allergic rhinitis    Asthma    Gastroesophageal reflux       Dispostion: Disposition decision including need for hospitalization was considered, and patient discharged from emergency department.    Final Clinical Impression(s) / ED Diagnoses Final diagnoses:  Crushing injury of finger, initial encounter        Elnor Jayson LABOR, DO 10/25/24 [phone]

## 2024-10-25 NOTE — Discharge Instructions (Addendum)
 It was a pleasure caring for you today in the emergency department.  You likely have a bruise or contusion to your finger.  You can alternate over-the-counter acetaminophen or ibuprofen  as needed for discomfort.  Applying ice can also be helpful.  Recommend you avoid heavy lifting over the next few days.  Please return to the emergency department for any worsening or worrisome symptoms.

## 2024-10-25 NOTE — ED Triage Notes (Signed)
 Right middle finger injury while working with floor tiles , painful ROM
